# Patient Record
Sex: Female | Born: 1965 | Race: White | Hispanic: No | Marital: Married | Smoking: Never smoker
Health system: Southern US, Community
[De-identification: ages and names within clinical notes are randomized; demographics above are authoritative.]

## PROBLEM LIST (undated history)

## (undated) DIAGNOSIS — N840 Polyp of corpus uteri: Secondary | ICD-10-CM

## (undated) DIAGNOSIS — R222 Localized swelling, mass and lump, trunk: Secondary | ICD-10-CM

## (undated) HISTORY — PX: DILATION AND CURETTAGE OF UTERUS: SHX78

## (undated) HISTORY — PX: TUBAL LIGATION: SHX77

## (undated) HISTORY — PX: HYSTEROSCOPY: SHX211

## (undated) HISTORY — PX: PELVIC LAPAROSCOPY: SHX162

## (undated) HISTORY — DX: Polyp of corpus uteri: N84.0

## (undated) HISTORY — DX: Localized swelling, mass and lump, trunk: R22.2

---

## 1998-10-21 ENCOUNTER — Other Ambulatory Visit: Admission: RE | Admit: 1998-10-21 | Discharge: 1998-10-21 | Payer: Self-pay | Admitting: Gynecology

## 1998-11-22 ENCOUNTER — Ambulatory Visit (HOSPITAL_COMMUNITY): Admission: RE | Admit: 1998-11-22 | Discharge: 1998-11-22 | Payer: Self-pay | Admitting: Gynecology

## 1998-11-22 ENCOUNTER — Encounter (INDEPENDENT_AMBULATORY_CARE_PROVIDER_SITE_OTHER): Payer: Self-pay | Admitting: Specialist

## 1999-09-06 ENCOUNTER — Inpatient Hospital Stay (HOSPITAL_COMMUNITY): Admission: AD | Admit: 1999-09-06 | Discharge: 1999-09-08 | Payer: Self-pay | Admitting: Gynecology

## 1999-10-11 ENCOUNTER — Other Ambulatory Visit: Admission: RE | Admit: 1999-10-11 | Discharge: 1999-10-11 | Payer: Self-pay | Admitting: *Deleted

## 2000-10-12 ENCOUNTER — Other Ambulatory Visit: Admission: RE | Admit: 2000-10-12 | Discharge: 2000-10-12 | Payer: Self-pay | Admitting: *Deleted

## 2001-10-09 ENCOUNTER — Encounter: Payer: Self-pay | Admitting: *Deleted

## 2001-10-09 ENCOUNTER — Ambulatory Visit (HOSPITAL_COMMUNITY): Admission: RE | Admit: 2001-10-09 | Discharge: 2001-10-09 | Payer: Self-pay | Admitting: *Deleted

## 2001-10-25 ENCOUNTER — Other Ambulatory Visit: Admission: RE | Admit: 2001-10-25 | Discharge: 2001-10-25 | Payer: Self-pay | Admitting: Gynecology

## 2002-07-04 ENCOUNTER — Ambulatory Visit (HOSPITAL_BASED_OUTPATIENT_CLINIC_OR_DEPARTMENT_OTHER): Admission: RE | Admit: 2002-07-04 | Discharge: 2002-07-04 | Payer: Self-pay | Admitting: Gynecology

## 2002-10-23 ENCOUNTER — Ambulatory Visit (HOSPITAL_COMMUNITY): Admission: RE | Admit: 2002-10-23 | Discharge: 2002-10-23 | Payer: Self-pay | Admitting: Gynecology

## 2002-10-23 ENCOUNTER — Encounter: Payer: Self-pay | Admitting: Gynecology

## 2002-11-18 ENCOUNTER — Other Ambulatory Visit: Admission: RE | Admit: 2002-11-18 | Discharge: 2002-11-18 | Payer: Self-pay | Admitting: Gynecology

## 2003-02-27 ENCOUNTER — Other Ambulatory Visit: Admission: RE | Admit: 2003-02-27 | Discharge: 2003-02-27 | Payer: Self-pay | Admitting: Gynecology

## 2003-09-17 ENCOUNTER — Encounter (INDEPENDENT_AMBULATORY_CARE_PROVIDER_SITE_OTHER): Payer: Self-pay | Admitting: Specialist

## 2003-09-17 ENCOUNTER — Inpatient Hospital Stay (HOSPITAL_COMMUNITY): Admission: RE | Admit: 2003-09-17 | Discharge: 2003-09-20 | Payer: Self-pay | Admitting: Gynecology

## 2003-11-03 ENCOUNTER — Other Ambulatory Visit: Admission: RE | Admit: 2003-11-03 | Discharge: 2003-11-03 | Payer: Self-pay | Admitting: Gynecology

## 2004-11-03 ENCOUNTER — Other Ambulatory Visit: Admission: RE | Admit: 2004-11-03 | Discharge: 2004-11-03 | Payer: Self-pay | Admitting: Gynecology

## 2005-01-02 DIAGNOSIS — N840 Polyp of corpus uteri: Secondary | ICD-10-CM

## 2005-01-02 HISTORY — DX: Polyp of corpus uteri: N84.0

## 2005-01-19 ENCOUNTER — Ambulatory Visit (HOSPITAL_BASED_OUTPATIENT_CLINIC_OR_DEPARTMENT_OTHER): Admission: RE | Admit: 2005-01-19 | Discharge: 2005-01-19 | Payer: Self-pay | Admitting: Gynecology

## 2005-01-19 ENCOUNTER — Encounter (INDEPENDENT_AMBULATORY_CARE_PROVIDER_SITE_OTHER): Payer: Self-pay | Admitting: *Deleted

## 2005-10-18 ENCOUNTER — Ambulatory Visit (HOSPITAL_COMMUNITY): Admission: RE | Admit: 2005-10-18 | Discharge: 2005-10-18 | Payer: Self-pay | Admitting: Gynecology

## 2005-11-30 ENCOUNTER — Other Ambulatory Visit: Admission: RE | Admit: 2005-11-30 | Discharge: 2005-11-30 | Payer: Self-pay | Admitting: Gynecology

## 2006-11-19 ENCOUNTER — Ambulatory Visit (HOSPITAL_COMMUNITY): Admission: RE | Admit: 2006-11-19 | Discharge: 2006-11-19 | Payer: Self-pay | Admitting: Gynecology

## 2006-12-13 ENCOUNTER — Other Ambulatory Visit: Admission: RE | Admit: 2006-12-13 | Discharge: 2006-12-13 | Payer: Self-pay | Admitting: Gynecology

## 2007-01-03 HISTORY — PX: OTHER SURGICAL HISTORY: SHX169

## 2007-01-25 ENCOUNTER — Encounter: Payer: Self-pay | Admitting: Gynecology

## 2007-01-25 ENCOUNTER — Ambulatory Visit (HOSPITAL_BASED_OUTPATIENT_CLINIC_OR_DEPARTMENT_OTHER): Admission: RE | Admit: 2007-01-25 | Discharge: 2007-01-25 | Payer: Self-pay | Admitting: Gynecology

## 2008-07-17 ENCOUNTER — Encounter: Payer: Self-pay | Admitting: Gynecology

## 2008-07-17 ENCOUNTER — Other Ambulatory Visit: Admission: RE | Admit: 2008-07-17 | Discharge: 2008-07-17 | Payer: Self-pay | Admitting: Gynecology

## 2008-07-17 ENCOUNTER — Ambulatory Visit (HOSPITAL_COMMUNITY): Admission: RE | Admit: 2008-07-17 | Discharge: 2008-07-17 | Payer: Self-pay | Admitting: Gynecology

## 2008-07-17 ENCOUNTER — Ambulatory Visit: Payer: Self-pay | Admitting: Gynecology

## 2009-11-09 IMAGING — MG MM DIGITAL SCREENING BILAT W/ CAD
6 series · 6 of 6 positions shown · non-contrast
Comparison: none

DG SCREEN MAMMOGRAM BILATERAL
Bilateral CC and MLO view(s) were taken.

DIGITAL SCREENING MAMMOGRAM WITH CAD:
There are scattered fibroglandular densities.  No masses or malignant type calcifications are 
identified.  Compared with prior studies.

[R CC]
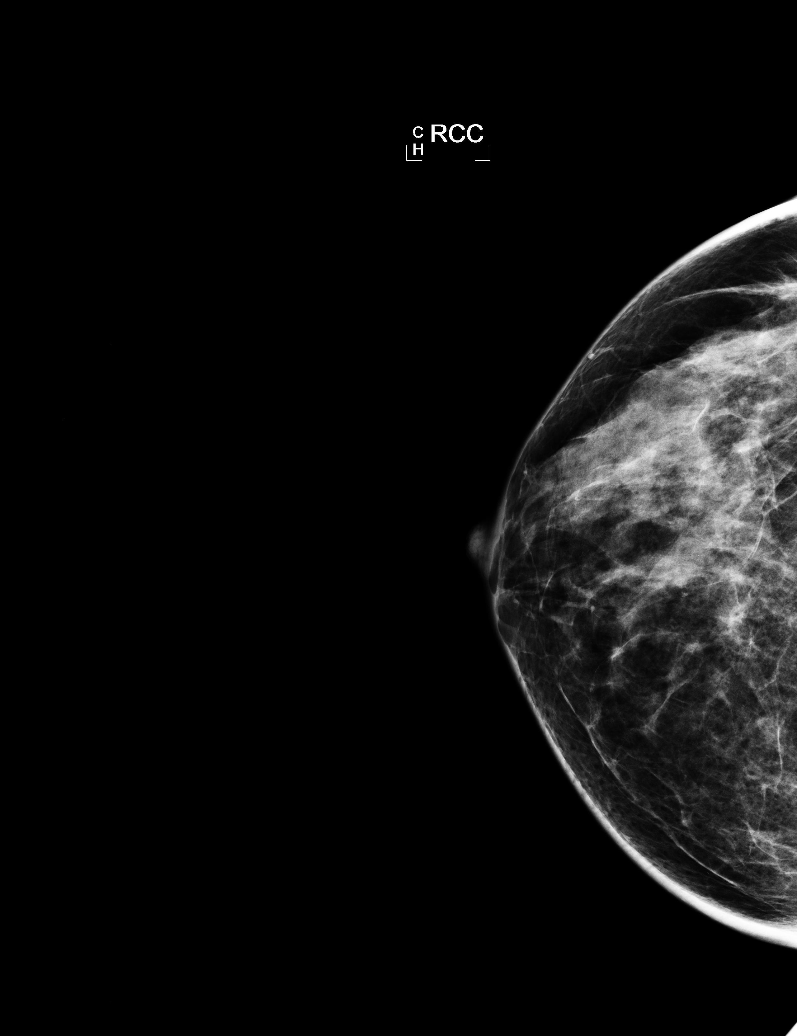

[R MLO]
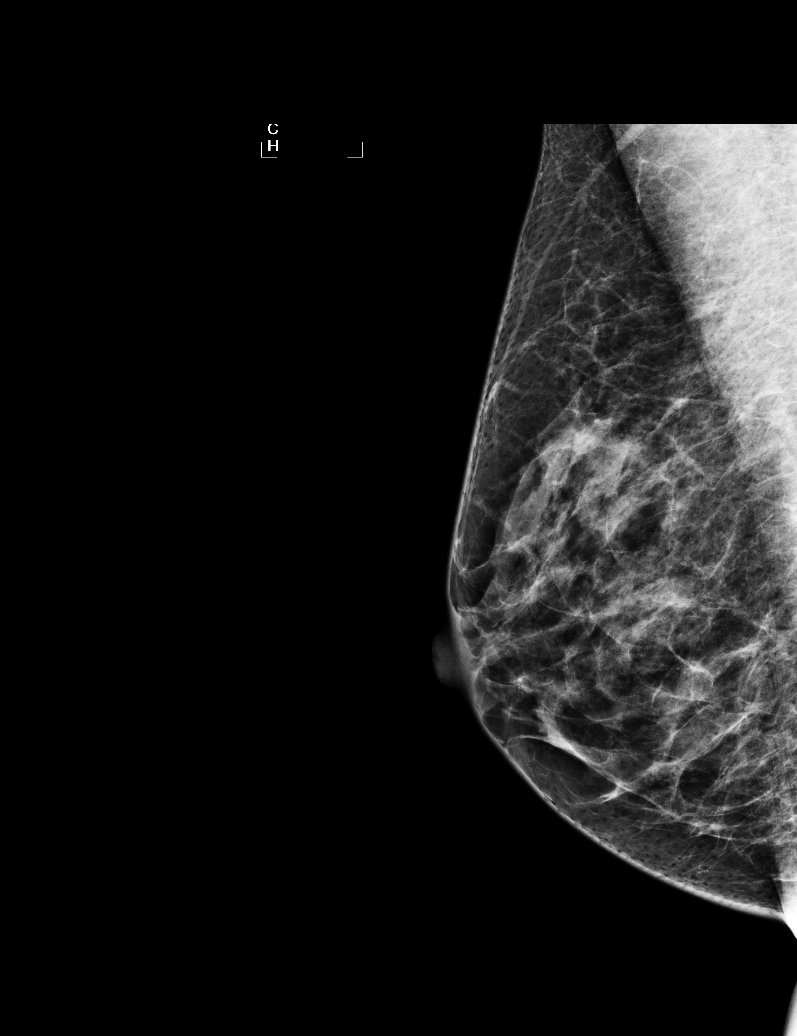

[L CC]
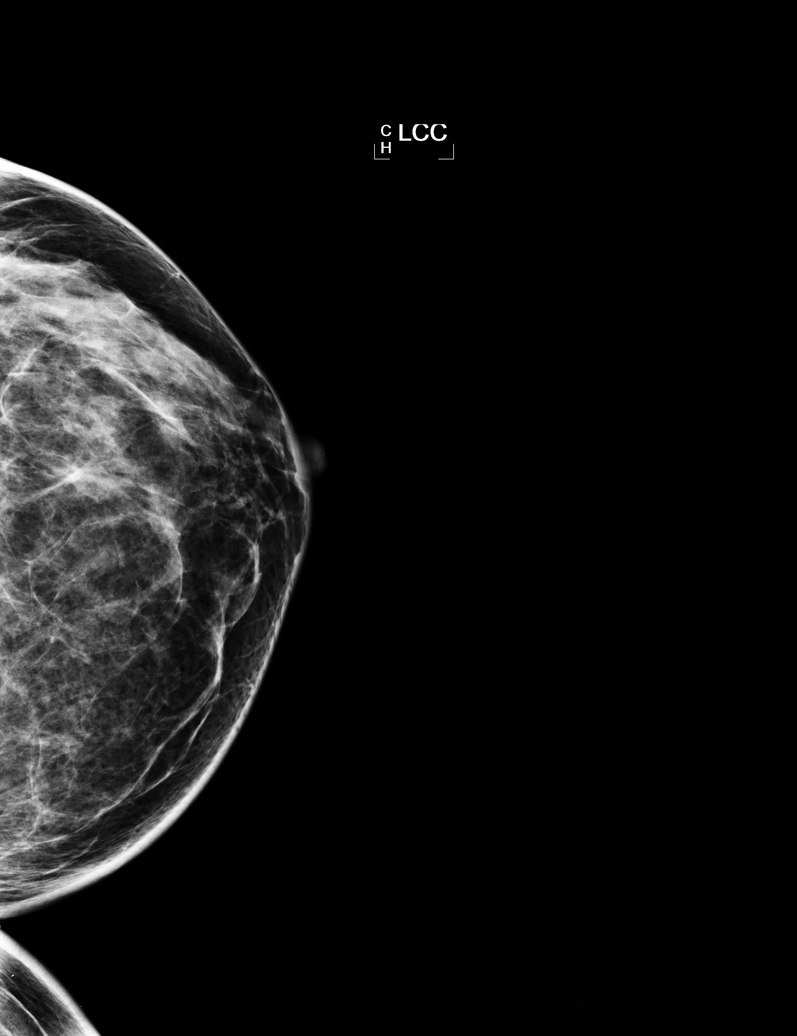

[L MLO]
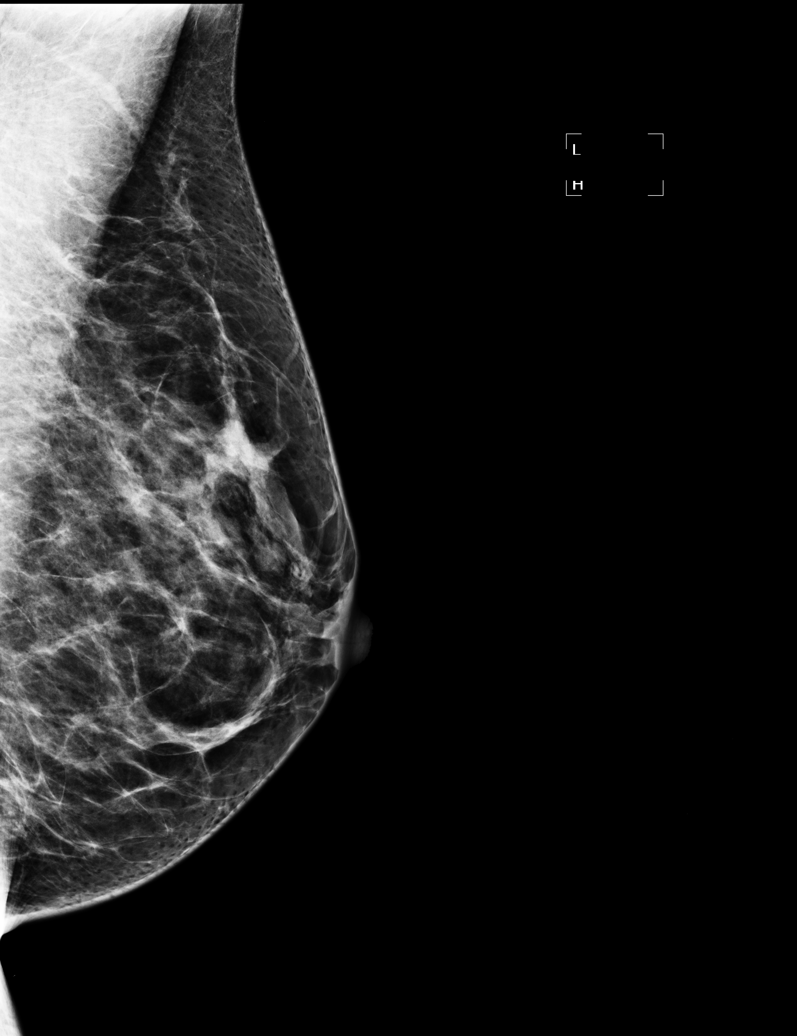

[L XCCL]
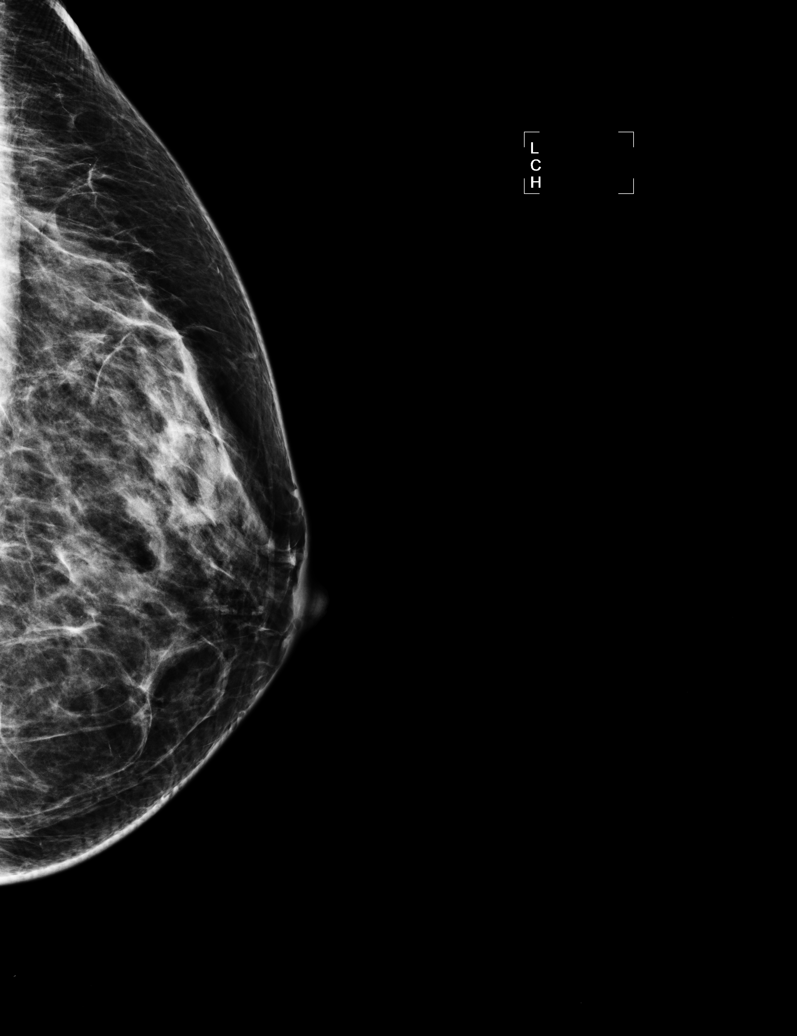

[R XCCL]
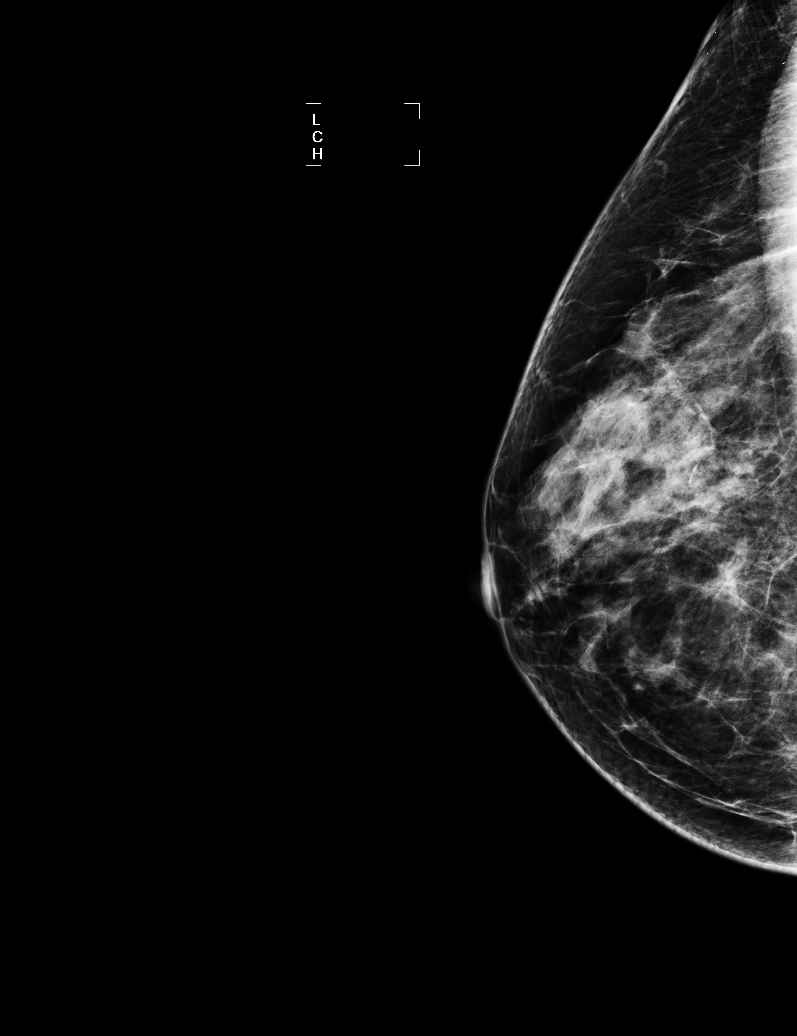

[6 of 6 positions shown; findings below may reference images not displayed]

IMPRESSION: No specific mammographic evidence of malignancy.  Next screening mammogram is recommended in one 
year.

ASSESSMENT: Negative - BI-RADS 1

Screening mammogram in 1 year.
ANALYZED BY COMPUTER AIDED DETECTION. , THIS PROCEDURE WAS A DIGITAL MAMMOGRAM.

## 2010-01-23 ENCOUNTER — Encounter: Payer: Self-pay | Admitting: Gynecology

## 2010-01-24 ENCOUNTER — Encounter: Payer: Self-pay | Admitting: Gynecology

## 2010-05-17 NOTE — H&P (Signed)
NAME:  [name] [name], [name] [name]               ACCOUNT NO.:  [id number]   MEDICAL RECORD NO.:  [id number]          PATIENT TYPE:  AMB   LOCATION:  [facility]                         FACILITY:  [facility]   PHYSICIAN:  Timothy P. Fontaine, M.D.DATE OF BIRTH:  1965-11-13   DATE OF ADMISSION:  01/25/2007  DATE OF DISCHARGE:                              HISTORY & PHYSICAL   CHIEF COMPLAINT:  Menorrhagia.   HISTORY OF PRESENT ILLNESS:  [name] G3, P2, AB one female, tubal  sterilization, presents with worsening menorrhagia bleeding now seven  days per month with significant bleeding.  Outpatient evaluation  includes Sonohystogram which shows a large endometrial polyp measuring  16 mm, and she is admitted at this time for hysteroscopy, D&C, resection  of endometrial polyp.  She does have a prior history of endometrial  polyps with a hyperplastic polyp removed January 2007.  The patient had  a follow-up endometrial sampling November 2007 which was normal.   PAST MEDICAL HISTORY:  Uncomplicated.   PAST SURGICAL HISTORY:  1. Laparoscopy x2 for endometriosis.  2. Cesarean section.  3. Tubal sterilization.  4. Hysteroscopy.  5. D&C.   CURRENT MEDICATIONS:  None other than multivitamins.   ALLERGIES:  NO MEDICATIONS.   REVIEW OF SYSTEMS:  Noncontributory.   FAMILY HISTORY:  Noncontributory.   SOCIAL HISTORY:  Noncontributory.   PHYSICAL EXAMINATION:  VITAL SIGNS:  Afebrile, vital signs stable.  HEENT: Normal.  LUNGS:  Clear.  CARDIAC:  Regular rate without rubs, murmurs or gallops.  ABDOMEN:  Benign.  PELVIC:  External BUS, vagina normal.  Cervix normal.  Uterus normal  size, midline mobile, nontender.  Adnexa without masses or tenderness.   ASSESSMENT:  [name] G3, P2, AB one female with worsening  menorrhagia, history of hyperplastic polyp in the past, current  Sonohystogram confirms a generous 16 mm endometrial polyp.  The patient  is admitted at this time for hysteroscopy and D&C  resection of her  endometrial polyp.  I have reviewed with her the proposed surgery,  expected intraoperative postoperative courses, instrumentation use of  the resectoscope, D&C.  The risk of infection, bleeding, hemorrhage,  transfusion, uterine perforation, damage to internal organs including  bowel, bladder, ureters, vessels and nerves either immediately  recognized or delay recognized necessitating future reparative  surgeries, bowel resection, ostomy formation was all discussed,  understood and accepted.  The patient's questions were answered to her  satisfaction.  She is ready to proceed with surgery.      Timothy P. Fontaine, M.D.  Electronically Signed     TPF/MEDQ  D:  01/23/2007  T:  01/23/2007  Job:  [id number]

## 2010-05-17 NOTE — Op Note (Signed)
NAME:  [name] [name], [name] [name]               ACCOUNT NO.:  [id number]   MEDICAL RECORD NO.:  [id number]          PATIENT TYPE:  AMB   LOCATION:  [facility]                         FACILITY:  [facility]   PHYSICIAN:  Timothy P. Fontaine, M.D.DATE OF BIRTH:  1965/07/02   DATE OF PROCEDURE:  01/25/2007  DATE OF DISCHARGE:                               OPERATIVE REPORT   PREOPERATIVE DIAGNOSES:  Menorrhagia, endometrial polyp.   POSTOPERATIVE DIAGNOSES:  Menorrhagia, endometrial polyp.   PROCEDURE:  Hysteroscopy, endometrial polypectomy, D and C.   SURGEON:  Timothy P. Fontaine, M.D.   ANESTHETIC:  General with 1% lidocaine paracervical block.   ESTIMATED BLOOD LOSS:  Minimal.   SORBITOL DISCREPANCY:  Minimal.   COMPLICATIONS:  None.   PATHOLOGY:  1. Endometrial polyp.  2. Endometrial curetting.   FINDINGS:  EUA external, BUS, vagina normal.  Cervix normal.  Uterus  normal size, midline and mobile.  Adnexa without masses.  Hysteroscopic  is adequate noting a large endometrial polyp emanating from the left  fundal region removed in its entirety.  Hysteroscopy was otherwise  normal noting right and left tubal ostia.  Fundus, anterior and  posterior uterine surfaces, lower uterine segment, endocervical canal  all normal.   PROCEDURE IN DETAIL:  The patient was taken to the operating room and  underwent general anesthesia and was placed in low dorsal lithotomy  position, received a perineal vaginal preparation with Betadine  solution.  Bladder emptied with in-and-out Foley catheterization.  EUA  performed and the patient was draped in the usual fashion.  The cervix  was visualized with a cervical speculum.  The anterior lip grasped with  a single tooth tenaculum and a paracervical block using 1% lidocaine was  placed.  A total of 12 mL.  The cervix was then gently gradually dilated  to admit the operative hysteroscope and hysteroscopy was performed with  findings noted above.  Using the right angle  resectoscope loop the  endometrial polyp was excised intact at the intersection of the polyp  and the surrounding endometrium and was sent to pathology.  A sharp  curettage was then performed and the curettage was sent to pathology.  Re-hysteroscopy showed an empty cavity, good distention, no evidence of  perforation.  The instruments  were then removed.  Adequate hemostasis visualized at the tenaculum  site.  Speculum removed.  The patient placed in a supine position,  awakened without difficulty and taken to recovery room in good condition  having tolerated the procedure well.      Timothy P. Fontaine, M.D.  Electronically Signed     TPF/MEDQ  D:  01/25/2007  T:  01/25/2007  Job:  [id number]

## 2010-05-20 NOTE — Op Note (Signed)
NAME:  Red, ZNYA ALBINO                         ACCOUNT NO.:  [id number]   MEDICAL RECORD NO.:  [id number]                   PATIENT TYPE:  INP   LOCATION:  9110                                 FACILITY:  [facility]   PHYSICIAN:  Timothy P. Fontaine, M.D.           DATE OF BIRTH:  1965-02-27   DATE OF PROCEDURE:  09/17/2003  DATE OF DISCHARGE:                                 OPERATIVE REPORT   ADDENDUM:   SPECIMENS:  1.  Samples of cord blood.  2.  Portions of right and portions of left fallopian tube.   DESCRIPTION OF PROCEDURE:  Following closure of the uterine incision per  prior operative note, the right fallopian tube was identified, traced from  its insertion to its fimbriated end and a mid tubal segment was elevated  with the Babcock clamp and double ligated using 0 plain suture.  The  intervening segment was excised and sent to pathology.  Adequate hemostasis  as well as tubal lumen was grossly identified.  A similar procedure was  carried out on the other side.  Subsequently, the uterus was returned to the  abdomen which was copiously irrigated showing adequate hemostasis and the  remainder of the operative report per prior dictation.                                               Timothy P. Audie Box, M.D.    TPF/MEDQ  D:  09/17/2003  T:  09/17/2003  Job:  [id number]

## 2010-05-20 NOTE — Discharge Summary (Signed)
NAME:  [name] [name], [name] [name]               ACCOUNT NO.:  [id number]   MEDICAL RECORD NO.:  [id number]          PATIENT TYPE:  INP   LOCATION:  9110                          FACILITY:  [facility]   PHYSICIAN:  Timothy P. Fontaine, M.D.DATE OF BIRTH:  06-Mar-1965   DATE OF ADMISSION:  09/17/2003  DATE OF DISCHARGE:  09/20/2003                                 DISCHARGE SUMMARY   DISCHARGE DIAGNOSES:  1.  Term pregnancy, prior traumatic vaginal birth, for primary cesarean      section.  2.  Desires permanent sterilization.   PROCEDURE:  Primary low transverse cervical cesarean section and bilateral  tubal sterilization, modified Pomeroy technique - September 17, 2003.  Pathology [id number]:  Fallopian tube left - complete transection, no  pathologic abnormalities.  Fallopian tube right - complete transection, no  pathologic abnormalities.   HOSPITAL COURSE:  The patient was admitted for a primary cesarean section  and tubal sterilization on September 17, 2003.  The patient's surgery was  uncomplicated and she was discharged on postoperative day #3 ambulating  well, tolerating a regular diet.  She received precautions, instructions,  and follow-up, and will be seen in the office in 6 weeks.  She received a  prescription for Demerol 50 mg #20 for pain and her postoperative hemoglobin  was 11.  The patient's blood type is B positive and her rubella titer was  positive.      TPF/MEDQ  D:  10/05/2003  T:  10/05/2003  Job:  [id number]

## 2010-05-20 NOTE — H&P (Signed)
NAME:  [name] [name], [name] [name]               ACCOUNT NO.:  [id number]   MEDICAL RECORD NO.:  [id number]          PATIENT TYPE:  AMB   LOCATION:  [facility]                         FACILITY:  [facility]   PHYSICIAN:  Timothy P. Fontaine, M.D.DATE OF BIRTH:  1965/02/20   DATE OF ADMISSION:  01/19/2005  DATE OF DISCHARGE:                                HISTORY & PHYSICAL   CHIEF COMPLAINT:  More frequent menses.   HISTORY OF PRESENT ILLNESS:  [name] G3, P2, [name] status post  tubal sterilization with menses every 2 weeks. Outpatient evaluation  included a normal prolactin FSH, TSH.  Sonohystogram which showed an area on  her left ovary, questionable endometrioma versus physiologic change  measuring 29 mm and a thickened endometrium. The patient is admitted at this  time for hysteroscopy D&C for rule out endometrial polyps hyperplasia.   PAST MEDICAL HISTORY:  Uncomplicated.   PAST SURGICAL HISTORY:  1.  Laparoscopy x2 for endometriosis.  2.  D&C.  3.  Cesarean section with tubal sterilization.   ALLERGIES:  None.   CURRENT MEDICATIONS:  None.   REVIEW OF SYSTEMS:  Noncontributory.   FAMILY HISTORY:  Noncontributory.   SOCIAL HISTORY:  Noncontributory.   ADMISSION PHYSICAL EXAMINATION:  VITAL SIGNS:  Afebrile. Vital signs stable.  HEENT: Normal.  LUNGS:  Clear.  CARDIAC:  Regular rate.  No murmurs, rubs, or gallops.  ABDOMEN:  Benign.  PELVIC:  External BUS, vagina normal. Cervix normal. Uterus grossly normal  in size, shape, and contour, midline, mobile. Adnexa without mass or  tenderness.   ASSESSMENT:  [name] status post tubal sterilization, more frequent  menses, sonohystogram with thickened endometrial area with some cystic  changes, for hysteroscopy D&C, rule out hyperplastic changes. The risks,  benefits, indications and alternatives for the procedure were reviewed with  the patient to include the expected intraoperative, postoperative courses.  The use of the  resectoscope, sharp curettage discussed, and the risks of  infection, prolonged antibiotics, bleeding, uterine perforation, damage to  internal organs including bowel, bladder,  ureters, vessels and nerves necessitating major exploratory reparative  surgeries and future reparative surgeries including ostomy formation was all  discussed, understood and accepted. The patient's questions were answered to  her satisfaction.  She is ready to proceed with surgery.      Timothy P. Fontaine, M.D.  Electronically Signed     TPF/MEDQ  D:  01/17/2005  T:  01/17/2005  Job:  [id number]

## 2010-05-20 NOTE — H&P (Signed)
NAME:  [name] [name], [name] [name]                         ACCOUNT NO.:  [id number]   MEDICAL RECORD NO.:  [id number]                   PATIENT TYPE:  AMB   LOCATION:  [facility]                                 FACILITY:  [facility]   PHYSICIAN:  Timothy P. Fontaine, M.D.           DATE OF BIRTH:  [birth year]   DATE OF ADMISSION:  07/04/2002  DATE OF DISCHARGE:                                HISTORY & PHYSICAL   CHIEF COMPLAINT:  Increasing dysmenorrhea.   HISTORY OF PRESENT ILLNESS:  [name] G2, P12, [name] history of  laparoscopic proven endometriosis in 2000 with history of recently  increasing dysmenorrhea reminiscent of her past history of endometriosis.  The patient is admitted at this time for laparoscopic evaluation and  treatment of suspected endometriosis.   PAST MEDICAL HISTORY:  Uncomplicated.   PAST SURGICAL HISTORY:  1. Laparoscopy 2000.  2. D&C for missed AB.   ALLERGIES:  No medications.   CURRENT MEDICATIONS:  None.   REVIEW OF SYSTEMS:  Noncontributory.   FAMILY HISTORY:  Noncontributory.   SOCIAL HISTORY:  Noncontributory.   PHYSICAL EXAMINATION:  VITAL SIGNS:  Afebrile.  Vital signs are stable.  HEENT:  Normal.  LUNGS:  Clear.  CARDIAC:  Regular rate without rubs, murmurs, or gallops.  ABDOMEN:  Benign.  PELVIC:  External, BUS, vagina normal.  Cervix grossly normal.  Uterus  normal size, nontender.  Adnexa without masses or tenderness.   ASSESSMENT:  [name] increasing dysmenorrhea, history of laparoscopic  proven endometriosis for laparoscopy.  I reviewed with patient and her  husband laser video laparoscopy to include instrumentation, trocar  placement, insufflation, use of sharp/blunt dissection, electrocautery, and  the use of laser.  I reviewed with them the risks to include that the  procedure may not help them with her pain nor achieving pregnancy and they  understand and accept this.  I reviewed the risks of infection, wound  complications  requiring opening and draining of incisions and prolonged  antibiotics, the risk of hemorrhage necessitating transfusion, and the risk  of transfusion including transfusion reaction, hepatitis, HIV, [place]  disease, and unknown entities was discussed with her.  The risks of  inadvertent injury to internal organs including bowel, bladder, ureters,  vessels, and nerves necessitating major exploratory reparative surgeries and  future reparative surgeries including ostomy formation was all discussed  with them.  She understands that I may or may no be able to address all  pathology encountered depending on its location and my assessment as to  safety.  She again understands there are no guarantees as far as pain relief  as well as fertility.  The patient did have intercourse the cycle of the  procedure and I reviewed with her that pregnancy is a possibility, although  she feels that this is unlikely.  I did check a serum pregnancy test  preoperatively which is negative but she understands that this will not  detect a very early pregnancy and the options of postponing procedure versus  proceeding were discussed.  The patient elects to proceed with the surgery  accepting the possibility that she could be early pregnant undetected by a  serum pregnancy test.  The patient's questions were answered to her  satisfaction.  She is ready to proceed with surgery.                                               Timothy P. Audie Box, M.D.    TPF/MEDQ  D:  07/03/2002  T:  07/03/2002  Job:  [id number]

## 2010-05-20 NOTE — Op Note (Signed)
NAME:  Mcwethy, TENEE WISH                         ACCOUNT NO.:  [id number]   MEDICAL RECORD NO.:  [id number]                   PATIENT TYPE:  INP   LOCATION:  NA                                   FACILITY:  [facility]   PHYSICIAN:  Timothy P. Fontaine, M.D.           DATE OF BIRTH:  09/21/1965   DATE OF PROCEDURE:  09/17/2003  DATE OF DISCHARGE:                                 OPERATIVE REPORT   <PREOPERATIVE DIAGNOSES/>  1.  Pregnancy at term.  2.  Prior traumatic vaginal birth.  3.  Desires permanent sterilization.   POSTOPERATIVE DIAGNOSIS:  1.  Pregnancy at term.  2.  Prior traumatic vaginal birth.  3.  Desires permanent sterilization.   OPERATION PERFORMED:  1.  Primary low transverse cervical cesarean section.  2.  Bilateral tubal sterilization, modified Pomeroy technique.   SURGEON:  Timothy P. Fontaine, M.D.   ASSISTANT:  Ivor Costa. Farrel Gobble, M.D.   ANESTHESIA:  Spinal.   ESTIMATED BLOOD LOSS:  Less than 500 mL.   COMPLICATIONS:  None.   FINDINGS:  At 74, normal female, Apgars 8 and 9, weight 8 pounds, 9 ounces.  Pelvic anatomy noted to be normal.   DESCRIPTION OF PROCEDURE:  The patient was taken to the operating room and  underwent spinal anesthesia.  She was placed in the left tilt supine  position, received an abdominal preparation with Betadine solution.  Bladder  emptied with an indwelling Foley catheterization placed in sterile technique  by nursing personnel.  The patient was draped in the usual fashion.  After  assuring adequate anesthesia, the abdomen was sharply entered through a  Pfannenstiel incision, achieving adequate hemostasis at all levels.  The  bladder flap was sharply and bluntly developed without difficulty and the  uterus was sharply entered and the lower uterine segment bluntly extended  laterally.  The membranes were ruptured, the fluid noted to be clear.  The  infant's head delivered through the incision.  The nares and mouth  suctioned, the rest  of the infant delivered, the cord doubly clamped and cut  and the infant was handed to pediatrics in attendance.  Samples of cord  blood were obtained.  The placenta was spontaneously extruded and noted to  be intact. The uterus was exteriorized.  The endometrial cavity explored  with a sponge to remove all placental membrane fragments.  The uterine  incision was closed in one layer using 0 Vicryl suture in a running  interlocking stitch.  Several figure-of-eight sutures were subsequently  placed to achieve ultimate hemostasis.  The uterus was returned to the  abdomen which was copiously irrigated and the anterior fascia was  reapproximated using 0 Vicryl suture in a running stitch starting at the  angle meeting in the middle.  The subcutaneous tissues were irrigated.  Hemostasis achieved with electrocautery and the skin was reapproximated  using 4-0 Vicryl in a running subcuticular stitch.  Steri-Strips and benzoin  were applied.  Sterile dressing applied.  The patient was taken to the  recovery room in good condition having tolerated the procedure well.                                               Timothy P. Audie Box, M.D.    TPF/MEDQ  D:  09/17/2003  T:  09/17/2003  Job:  [id number]

## 2010-05-20 NOTE — H&P (Signed)
[facility]  Patient:    [name] [name], [name] [name]                  MRN: [id number] Adm. Date:  [id number] Attending:  Wetzel Bjornstad                         History and Physical  CHIEF COMPLAINT:              Latent labor.  HISTORY OF PRESENT ILLNESS:   The patient is a 45 year old G2, P0, at 40-3/7 weeks who presents to triage today complaining of contractions every three minutes.  The patient was noted to be 1-1/2 and 90% effaced in triage with reactive heart tones in the 150s.  The patient denies any rupture of membranes, vaginal bleeding, severe headache, nausea, vomiting, change in her bowel or bladder habits, or abdominal pains other than her contractions.  The patients prenatal course has been uncomplicated except for a positive GBS.  PAST MEDICAL HISTORY:         None.  PAST SURGICAL HISTORY:        D&C in 1993 after a spontaneous abortion and a laparoscopy in 1993 for pelvic pain.  PAST OBSTETRICAL HISTORY:     In 1993, spontaneous abortion at eight weeks.  PAST GYNECOLOGIC HISTORY:     Without sexually transmitted diseases or abnormal Pap smears.  PRENATAL LABORATORY DATA:     Hemoglobin 12, blood type B positive, antibody negative.  RPR is nonreactive.  Rubella nonimmune.  Hepatitis and HIV nonreactive.  Glucose 120.  PHYSICAL EXAMINATION:  VITAL SIGNS:                  Blood pressure 112/80, pulse 70.  HEENT:                        Throat clear.  NECK:                         Without any thyromegaly or JVD.  LUNGS:                        Lungs are clear to auscultation bilaterally.  HEART:                        Regular rate and rhythm without murmurs, rubs or gallops.  ABDOMEN:                      Gravid, nontender.  Estimated fetal weight 7 pounds, 12 ounces.  PELVIC:                       Cervix is noted to be 1 and 80 on admission. Tocometer q.3, fetal heart tones 150 and reactive.  ASSESSMENT AND PLAN:          A  45 year old G2, P0 at 40-3/7 weeks in latent labor.  The patient had amniotomy which had meconium.  An IUPC was placed and amnioinfusion started.  The patients cervix upon IUPC placement was 4 cm, 100% effaced and a -1 station.  Fetal heart tones are still in the 150s and reactive.  The patient is getting penicillin for positive group B strep. Anticipate a spontaneous vaginal delivery. DD:  09/06/99 TD:  09/06/99 Job: [id number]

## 2010-05-20 NOTE — Op Note (Signed)
NAME:  [name], [name] KUBE                         ACCOUNT NO.:  [id number]   MEDICAL RECORD NO.:  [id number]                   PATIENT TYPE:  AMB   LOCATION:  [facility]                                 FACILITY:  [facility]   PHYSICIAN:  Timothy P. Fontaine, M.D.           DATE OF BIRTH:  02-Apr-1965   DATE OF PROCEDURE:  07/04/2002  DATE OF DISCHARGE:                                 OPERATIVE REPORT   PREOPERATIVE DIAGNOSIS:  Endometriosis.   POSTOPERATIVE DIAGNOSIS:  Endometriosis.   OPERATION/PROCEDURE:  1. Diagnostic laparoscopy  2. Fulguration endometriosis.   SURGEON:  Timothy P. Fontaine, M.D.   ANESTHESIA:  General.   ESTIMATED BLOOD LOSS:  Minimal.   COMPLICATIONS:  None.   SPECIMENS:  None.   FINDINGS:  Anterior cul-de-sac normal.  Posterior cul-de-sac with old  scarring along left uterosacral ligament.  No active endometriosis  visualized.  Uterus normal size, shape, contour.  Right and left fallopian  tubes normal length and caliber, fimbriated ends free and mobile.  Right and  left ovaries grossly normal, free and mobile.  No other evidence of  overlying endometriosis or endometriomas.  Right pelvic sidewall is grossly  normal.  Ureter visualized.  Peristalsis left pelvic sidewall.  Small brown  patch of classic-appearing endometriosis at the utero-ovarian ligament.  Superficially bipolar cauterized.  White fibrosis noted along the left  pelvic sidewall in the area of the major vessels and ureter, thickening of  the peritoneum with also vascular changes.  Ureter was not visualized on  this side.  Appendix grossly normal, free and mobile.  Upper abdominal exam  was grossly normal.  Liver smooth and no abnormalities, gallbladder grossly  normal.   DESCRIPTION OF PROCEDURE:  The patient was taken to the operating room and  underwent general anesthesia, placed on the low dorsal lithotomy position.  Received an abdominal peritoneal vaginal preparation with Betadine  solution.  Bladder empty with in-and-out Foley catheter catheterization.  Urinalysis  performed.  A Hulka tenaculum was placed on the cervix.  The patient was  draped in the usual fashion.  A vertical infraumbilical incision was made,  the Veress needle placed and position verified with water and approximately  3 L of carbon dioxide gas infused without difficulty.  The 10 mm  laparoscopic trocar was then placed without difficulty, its position  verified visually.  A left of midline suprapubic port, 5 mm, was then placed  under direct visualization after transillumination for the vessels without  difficulty and the initial pelvic inspection was made.  A second midline, 5  mm suprapubic port was then placed to facilitate visualization and  manipulation of the pelvic organs under direct visualization after  transillumination for the vessels without difficulty.  Examination of the  pelvic organs was then carried out with findings noted above.  The left  pelvic sidewall did show fibrosis with the vascular changes.  There was one  small brown patch of  classic endometriosis at the utero-ovarian ligament  area and this was superficially bipolar cauterized.  I did not see any  active-appearing classic endometriotic implants such as powder burns, shaggy  red or small patchy white areas.  She did have fibrotic changes along the  peritoneum as well as some hypervascular changes along the entire vascular  line thickening the peritoneum such that identification of the ureter was  not possible.  I did not feel it was prudent to either dissect into the  pelvic sidewall to identify the ureter or try to excise all of this white  area for fear of underlying vascular or ureteral damage.  The suprapubic  ports were then removed, the gas allowed to escape.  All areas were  inspected under low pressure situation showing adequate hemostasis.  The  infraumbilical port was then backed up under direct visualization  showing  adequate hemostasis.  No evidence of hernia formation.  A 0 Vicryl  interrupted subcuticular fascial stitch was placed infraumbilically.  All  sites were injected with 0.25% Marcaine subcutaneously and all skin  incisions were closed using Dermabond skin adhesive.  The Hulka tenaculum  was removed, the patient placed in the status post, awakened without  difficulty and taken to the recovery room in good condition having tolerated  the procedure well.                                                Timothy P. Audie Box, M.D.    TPF/MEDQ  D:  07/04/2002  T:  07/04/2002  Job:  [id number]

## 2010-05-20 NOTE — Op Note (Signed)
NAME:  [name] [name], [name] [name]               ACCOUNT NO.:  [id number]   MEDICAL RECORD NO.:  [id number]          PATIENT TYPE:  AMB   LOCATION:  [facility]                         FACILITY:  [facility]   PHYSICIAN:  Timothy P. Fontaine, M.D.DATE OF BIRTH:  1965/08/06   DATE OF PROCEDURE:  01/19/2005  DATE OF DISCHARGE:                                 OPERATIVE REPORT   PREOPERATIVE DIAGNOSIS:  Metrorrhagia, rule out polyp.   POSTOPERATIVE DIAGNOSIS:  Metrorrhagia, rule out polyp.   PROCEDURE:  Hysteroscopy D&C.   SURGEON:  Dr. Audie Box   ANESTHETIC:  General, 1% lidocaine paracervical block.   SPECIMEN:  Endometrial curetting.   ESTIMATED BLOOD LOSS:  Minimal.   SORBITOL DISCREPANCY:  Minimal.   COMPLICATIONS:  None.   FINDING:  Hysteroscopy:  Adequate noting fundus, anterior-posterior surface.  His right and left tubal ostia, lower uterine segment, endocervical canal  all visualized.  The patient had a small elevated polypoid area, posterior  uterine surface, questionable polyp versus endometrial flap due to  dilatation, sent with endometrial curetting specimen.   PROCEDURE:  The patient was taken to the operating room, underwent general  anesthesia, was placed in low dorsal lithotomy position, received a perineal-  vaginal preparation with Betadine solution, bladder emptied with in-and-out  Foley catheterization, EUA was performed which was normal.  The patient was  draped in usual fashion, cervix visualized with a speculum, anterior lip  grasped with a single-tooth tenaculum, and a paracervical block using 1%  lidocaine was placed, a total of 10 mL used.  Cervix was gently gradually  dilated to admit the operative hysteroscope, and hysteroscopy was performed  with findings noted above.  A sharp curettage was then performed, and the  specimen was sent to pathology.  Re-hysteroscopy showed an empty, normal-  appearing cavity with good distention, no evidence of perforation.  The  instruments  were removed.  There was some oozing from the tenaculum site  which was controlled with silver nitrate application.  The patient was  placed in the supine position, awakened without difficulty, and taken to the  recovery room in good condition having tolerated the procedure well.     Timothy P. Fontaine, M.D.  Electronically Signed    TPF/MEDQ  D:  01/19/2005  T:  01/19/2005  Job:  [id number]

## 2010-05-20 NOTE — Discharge Summary (Signed)
[facility]  Patient:    [name] [name], [name] [name]                  MRN: [id number] Adm. Date:  [id number] Disc. Date: [id number] Attending:  Wetzel Bjornstad Dictator:   Antony Contras, [facility]                           Discharge Summary  DISCHARGE DIAGNOSES:          1. Intrauterine pregnancy at 40-3/7 weeks with                                  spontaneous onset of labor.                               2. History of positive group B streptococcus                                  culture.  PROCEDURES:                   1. Vacuum-assisted vaginal delivery.                               2. Delivery of a viable infant.                               3. Second degree midline episiotomy.  HISTORY OF PRESENT ILLNESS:   The patient is a 45 year old, gravida 2, para 0-0-1-0, with LMP November 25, 1998, [facility] September 03, 1999.  The patients only prenatal risk factor was nonimmunity to rubella and she was GBS positive.  PRENATAL LABORATORY DATA:     Blood type B+.  Antibody screen negative.  RPR, HBSAG, and HIV nonreactive.  The patient also had an elevated MSAFP, but did decline amniocentesis after extensive counseling.  HOSPITAL COURSE AND TREATMENT:                    The patient was admitted on September 06, 1999, in latent labor.  The cervix was 1 cm, 90%, and -2.  Artificial rupture of membranes revealed meconium-stained fluid.  An IUPC was placed and amnioinfusion started.  She did progress to complete dilatation and was delivered with vacuum-assisted vaginal delivery over a second degree midline episiotomy of an Apgar 8 and 72 female infant weighing 7 pounds 12 ounces.  The midline episiotomy was extended to a fourth degree.  Post delivery, she remained afebrile.  She had no difficulty voiding.  She was able to be discharged on her second postpartum day.  She did receive rubella vaccine prior to discharge.  CBC:  Hematocrit 29.2, hemoglobin 10.1, platelets  185.  DISPOSITION:                  Follow up in the office in six weeks.  DISCHARGE MEDICATIONS:        Continue prenatal vitamins and iron and stool softeners. DD:  09/30/99 TD:  09/30/99 Job: [id number]

## 2010-05-20 NOTE — H&P (Signed)
NAME:  [name] [name], [name] [name]                         ACCOUNT NO.:  [id number]   MEDICAL RECORD NO.:  [id number]                   PATIENT TYPE:  INP   LOCATION:  NA                                   FACILITY:  [facility]   PHYSICIAN:  Timothy P. Fontaine, M.D.           DATE OF BIRTH:  [birth year]   DATE OF ADMISSION:  09/17/2003  DATE OF DISCHARGE:                                HISTORY & PHYSICAL   Being admitted to [facility] for surgery September 17, 2003 at 7:30.   CHIEF COMPLAINT:  Pregnancy at term, prior traumatic vaginal delivery for  primary cesarean section.   HISTORY OF PRESENT ILLNESS:  [age]-year-old G3, P1 female at 38-1/2  weeks admitted for primary cesarean section.  Patient has a past history of  a vaginal delivery with significant vaginal tears who elected for a primary  cesarean section.  Patient relates significant perineal trauma led to a very  difficult postpartum recovery period and she would prefer to proceed with a  cesarean delivery.  For the remainder of her history see her [facility].   EXAMINATION:  HEENT:  Normal.  LUNGS:  Clear.  CARDIAC:  Regular rate.  No rubs, murmurs, or gallops.  ABDOMEN:  Gravid vertex fetus appropriate for term.  Positive fetal heart  tones.  PELVIC:  Deferred.   ASSESSMENT AND PLAN:  [age]-year-old G3, P1 female 38 weeks for  primary cesarean section.  Risks, benefits, indications and alternatives for  the proposed surgery were reviewed with her to include the option for a  vaginal delivery.  The patient strongly desires a cesarean section and she  also desires a tubal sterilization at the time of her cesarean section.  I  reviewed the risks to include bleeding, transfusion, infection, prolonged  antibiotics, wound complications requiring opening and draining of  incisions, closure by secondary intention, inadvertant injury to internal  organs including bowel, bladder, ureters, vessels and nerves necessitating  major  exploratory reparative surgeries and future reparative surgeries  including ostomy formation.  The risks of fetal injury during the birthing  process was also discussed, understood and accepted.  Finally the permanency  of tubal sterilization was discussed as well as the risk of failure and she  understands and accepts both of these issues.  The patient's questions were  answered to her satisfaction and she is ready to proceed with surgery.                                               Timothy P. Audie Box, M.D.    TPF/MEDQ  D:  09/11/2003  T:  09/11/2003  Job:  [id number]

## 2010-08-04 ENCOUNTER — Other Ambulatory Visit: Payer: Self-pay | Admitting: Gynecology

## 2010-08-04 DIAGNOSIS — Z1231 Encounter for screening mammogram for malignant neoplasm of breast: Secondary | ICD-10-CM

## 2010-08-12 ENCOUNTER — Encounter: Payer: Self-pay | Admitting: Gynecology

## 2010-08-18 ENCOUNTER — Ambulatory Visit (HOSPITAL_COMMUNITY)
Admission: RE | Admit: 2010-08-18 | Discharge: 2010-08-18 | Disposition: A | Discharge status: Home / Self Care | Payer: BC Managed Care – PPO | Source: Ambulatory Visit | Attending: Gynecology | Admitting: Gynecology

## 2010-08-18 ENCOUNTER — Ambulatory Visit (INDEPENDENT_AMBULATORY_CARE_PROVIDER_SITE_OTHER): Payer: BC Managed Care – PPO | Admitting: Gynecology

## 2010-08-18 ENCOUNTER — Other Ambulatory Visit (HOSPITAL_COMMUNITY)
Admission: RE | Admit: 2010-08-18 | Discharge: 2010-08-18 | Disposition: A | Discharge status: Home / Self Care | Payer: BC Managed Care – PPO | Source: Ambulatory Visit | Attending: Gynecology | Admitting: Gynecology

## 2010-08-18 ENCOUNTER — Encounter: Payer: Self-pay | Admitting: Gynecology

## 2010-08-18 VITALS — BP 116/72 | Ht 64.5 in | Wt 150.0 lb

## 2010-08-18 DIAGNOSIS — Z01419 Encounter for gynecological examination (general) (routine) without abnormal findings: Secondary | ICD-10-CM | POA: Insufficient documentation

## 2010-08-18 DIAGNOSIS — F411 Generalized anxiety disorder: Secondary | ICD-10-CM

## 2010-08-18 DIAGNOSIS — Z13220 Encounter for screening for lipoid disorders: Secondary | ICD-10-CM

## 2010-08-18 DIAGNOSIS — N926 Irregular menstruation, unspecified: Secondary | ICD-10-CM

## 2010-08-18 DIAGNOSIS — Z1231 Encounter for screening mammogram for malignant neoplasm of breast: Secondary | ICD-10-CM | POA: Insufficient documentation

## 2010-08-18 DIAGNOSIS — F419 Anxiety disorder, unspecified: Secondary | ICD-10-CM

## 2010-08-18 DIAGNOSIS — Z131 Encounter for screening for diabetes mellitus: Secondary | ICD-10-CM

## 2010-08-18 DIAGNOSIS — R222 Localized swelling, mass and lump, trunk: Secondary | ICD-10-CM

## 2010-08-18 DIAGNOSIS — R635 Abnormal weight gain: Secondary | ICD-10-CM

## 2010-08-18 MED ORDER — FLUOXETINE HCL 10 MG PO CAPS: 10.0000 mg | ORAL_CAPSULE | Freq: Every day | ORAL | Status: AC

## 2010-08-18 NOTE — Progress Notes (Addendum)
PAYTYN MESTA 10-Jun-1965 [id number]        45 y.o.  for annual exam.  Complaining of difficulty losing weight has gained over 20 pounds over the past several months despite exercising and eating properly. Also has an area and anterior chest wall the she wants me to take a look at. She's noticed a swelling that is mildly tender.  Her menses continue to be somewhat irregular she is status post endometrial ablation. They last anywhere from 8-10 days. No intermenstrual bleeding. We previously discussed options and she prefers just observation.  Past medical history,surgical history, allergies, family history and social history were all reviewed and documented in the EPIC chart. ROS:  Was performed and pertinent positives and negatives are included in the history.  Exam: chaperone present Filed Vitals:   08/18/10 1518  BP: 116/72   General appearance  Normal Skin grossly normal Head/Neck normal with no cervical or supraclavicular adenopathy thyroid normal Lungs  Clear.  There is a swelling over her left clavicle proximally the inner third approximately 2 cm across. Tender to touch no overlying skin changes difficult to tell if it's attached to the clavicle or in the tissues overlying. Cardiac RR, without RMG Abdominal  soft, nontender, without masses, organomegaly or hernia Breasts  examined lying and sitting without masses, retractions, discharge or axillary adenopathy. Pelvic  Ext/BUS/vagina  normal   Cervix  normal  Pap done  Uterus  anteverted, normal size, shape and contour, midline and mobile nontender   Adnexa  Without masses or tenderness    Anus and perineum  normal   Rectovaginal  normal sphincter tone without palpated masses or tenderness.    Assessment/Plan:  45 y.o. female for annual exam.    #1 Weight gain. Her exam is normal will check baseline TSH. I discussed with her exercise and diet. I recommended that if her weight gain continues despite a normal TSH that I would start  with an internal medicine evaluation to make sure there is nothing else that we are missing. #2 Clavicular mass. Discussed with patient referred to cardiothoracic to examine her and do studies as they feel appropriate. I cannot tell if it's within the clavicle or overlying tissues but I think that they would be the best to evaluate this. Patient knows that we will make this referral she'll followup with them. #3 Longer menses. We discussed in the past evaluation and treatment options. She declined ultrasound and she wants a minimalistic approach. Will check baseline TSH & FSH and prolactin. Options for management include low-dose oral contraceptives, attempted Mirena IUD re-endometrial ablation and hysterectomy were all reviewed. The risks of low dose contraceptives were discussed to include stroke, heart attack, DVT. She is healthy, exercises, does not smoke is not being followed for any medical issues. She is afraid this may contribute to weight gain does not want do anything at this time. #4 Fluoxetine. She is on fluoxetine 10 mg daily doing well with this and wants to continue and refill  times a year. #5 Health maintenance. Self breast exams on a monthly basis discussed encouraged, just had mammogram today we'll continue with annual mammography.    Dara Lords MD, 4:35 PM 08/18/2010

## 2010-08-22 ENCOUNTER — Telehealth: Payer: Self-pay | Admitting: *Deleted

## 2010-08-22 NOTE — Telephone Encounter (Signed)
PT CALLED WANTING LAB RESULT ON OFFICE VISIT 08/18/10. RESULTS GIVEN TO PT.

## 2010-08-23 ENCOUNTER — Other Ambulatory Visit: Payer: Self-pay | Admitting: *Deleted

## 2010-08-23 DIAGNOSIS — R222 Localized swelling, mass and lump, trunk: Secondary | ICD-10-CM

## 2010-09-07 ENCOUNTER — Encounter: Payer: BC Managed Care – PPO | Admitting: Cardiothoracic Surgery

## 2010-09-07 DIAGNOSIS — R222 Localized swelling, mass and lump, trunk: Secondary | ICD-10-CM | POA: Insufficient documentation

## 2010-09-07 DIAGNOSIS — N840 Polyp of corpus uteri: Secondary | ICD-10-CM

## 2010-09-08 ENCOUNTER — Encounter: Payer: BC Managed Care – PPO | Admitting: Cardiothoracic Surgery

## 2010-09-14 ENCOUNTER — Telehealth: Payer: Self-pay | Admitting: *Deleted

## 2010-09-14 NOTE — Telephone Encounter (Signed)
Dr. Dennie Maizes office called to say that patient did not show up for appointment.  I called and lm on patient's voice mail to call them to reschedule and also make sure she gets her chest xray prior to appointment as advised before.  Told patient to let us know what's going on.

## 2011-01-02 ENCOUNTER — Telehealth: Payer: Self-pay | Admitting: *Deleted

## 2011-01-02 NOTE — Telephone Encounter (Signed)
Pt called wanting to start on birth control pills, per TF last note(08/18/10) pills were offered to pt but she declined at the time. Pt was on Loestrin 24 and would like a cheaper pill to start. She has no insurance,she is up to date on her annual. Please advise.

## 2011-01-02 NOTE — Telephone Encounter (Signed)
Please see the below note

## 2011-01-02 NOTE — Telephone Encounter (Signed)
Pt called wanting to start on birth control pills, per TF last note pill were offered to pt but she declined at the time. Pt was on Loestrin 24 and would like something cheaper if possible. She has not insurance,she is up to date on her annual. Please advise.

## 2011-01-02 NOTE — Telephone Encounter (Signed)
Please call patient, Ortho-Cyclen 1 by mouth daily, one pack with 6 refills. Inform to start on first day of next cycle take daily. Inform H&H only cost $9 per month at [employer], [employer].

## 2011-01-04 NOTE — Telephone Encounter (Signed)
Lm for pt to call regarding the below note. 

## 2011-01-26 NOTE — Telephone Encounter (Signed)
Pt never returned phone call 

## 2014-09-22 ENCOUNTER — Telehealth: Payer: Self-pay | Admitting: *Deleted

## 2014-09-22 NOTE — Telephone Encounter (Signed)
Pt called c/o lump near rectum area,no drainage, not painful but has been there for some time now. Pt asked what should she do, pt last seen in 2012, has PCP, I advised pt to see PCP. Pt will do.
